# Patient Record
Sex: Female | Born: 1970 | Race: White | Hispanic: No | Marital: Married
Health system: Southern US, Community
[De-identification: ages and names within clinical notes are randomized; demographics above are authoritative.]

## PROBLEM LIST (undated history)

## (undated) DIAGNOSIS — Z789 Other specified health status: Secondary | ICD-10-CM

---

## 1997-12-28 ENCOUNTER — Other Ambulatory Visit: Admission: RE | Admit: 1997-12-28 | Discharge: 1997-12-28 | Payer: Self-pay | Admitting: *Deleted

## 1999-07-23 ENCOUNTER — Encounter: Payer: Self-pay | Admitting: Family Medicine

## 1999-07-23 ENCOUNTER — Encounter: Admission: RE | Admit: 1999-07-23 | Discharge: 1999-07-23 | Payer: Self-pay | Admitting: Family Medicine

## 2001-04-29 ENCOUNTER — Other Ambulatory Visit: Admission: RE | Admit: 2001-04-29 | Discharge: 2001-04-29 | Payer: Self-pay | Admitting: Family Medicine

## 2001-06-08 ENCOUNTER — Encounter: Admission: RE | Admit: 2001-06-08 | Discharge: 2001-06-08 | Payer: Self-pay | Admitting: Family Medicine

## 2001-06-08 ENCOUNTER — Encounter: Payer: Self-pay | Admitting: Family Medicine

## 2001-11-04 ENCOUNTER — Encounter: Payer: Self-pay | Admitting: Obstetrics and Gynecology

## 2001-11-04 ENCOUNTER — Ambulatory Visit (HOSPITAL_COMMUNITY): Admission: RE | Admit: 2001-11-04 | Discharge: 2001-11-04 | Payer: Self-pay | Admitting: Obstetrics and Gynecology

## 2002-03-29 ENCOUNTER — Inpatient Hospital Stay (HOSPITAL_COMMUNITY): Admission: AD | Admit: 2002-03-29 | Discharge: 2002-03-29 | Payer: Self-pay | Admitting: Obstetrics and Gynecology

## 2002-03-30 ENCOUNTER — Inpatient Hospital Stay (HOSPITAL_COMMUNITY): Admission: AD | Admit: 2002-03-30 | Discharge: 2002-04-01 | Payer: Self-pay | Admitting: Obstetrics and Gynecology

## 2002-05-13 ENCOUNTER — Other Ambulatory Visit: Admission: RE | Admit: 2002-05-13 | Discharge: 2002-05-13 | Payer: Self-pay | Admitting: Obstetrics and Gynecology

## 2003-09-05 ENCOUNTER — Other Ambulatory Visit: Admission: RE | Admit: 2003-09-05 | Discharge: 2003-09-05 | Payer: Self-pay | Admitting: Obstetrics and Gynecology

## 2005-02-06 ENCOUNTER — Other Ambulatory Visit: Admission: RE | Admit: 2005-02-06 | Discharge: 2005-02-06 | Payer: Self-pay | Admitting: Obstetrics and Gynecology

## 2005-03-04 ENCOUNTER — Encounter: Admission: RE | Admit: 2005-03-04 | Discharge: 2005-03-04 | Payer: Self-pay | Admitting: Obstetrics and Gynecology

## 2005-04-01 ENCOUNTER — Encounter: Admission: RE | Admit: 2005-04-01 | Discharge: 2005-04-01 | Payer: Self-pay | Admitting: Obstetrics and Gynecology

## 2005-05-01 ENCOUNTER — Encounter: Admission: RE | Admit: 2005-05-01 | Discharge: 2005-05-01 | Payer: Self-pay | Admitting: Obstetrics and Gynecology

## 2005-05-01 ENCOUNTER — Encounter (INDEPENDENT_AMBULATORY_CARE_PROVIDER_SITE_OTHER): Payer: Self-pay | Admitting: Specialist

## 2005-05-01 HISTORY — PX: BREAST BIOPSY: SHX20

## 2005-11-15 ENCOUNTER — Ambulatory Visit (HOSPITAL_COMMUNITY): Admission: RE | Admit: 2005-11-15 | Discharge: 2005-11-15 | Payer: Self-pay | Admitting: Obstetrics and Gynecology

## 2005-12-31 ENCOUNTER — Inpatient Hospital Stay (HOSPITAL_COMMUNITY): Admission: RE | Admit: 2005-12-31 | Discharge: 2006-01-02 | Payer: Self-pay | Admitting: Obstetrics and Gynecology

## 2006-02-17 ENCOUNTER — Ambulatory Visit (HOSPITAL_COMMUNITY): Admission: RE | Admit: 2006-02-17 | Discharge: 2006-02-17 | Payer: Self-pay | Admitting: Obstetrics and Gynecology

## 2006-04-18 ENCOUNTER — Ambulatory Visit (HOSPITAL_COMMUNITY): Admission: RE | Admit: 2006-04-18 | Discharge: 2006-04-18 | Payer: Self-pay | Admitting: Obstetrics and Gynecology

## 2006-04-25 ENCOUNTER — Ambulatory Visit (HOSPITAL_COMMUNITY): Admission: RE | Admit: 2006-04-25 | Discharge: 2006-04-25 | Payer: Self-pay | Admitting: Obstetrics and Gynecology

## 2006-05-19 ENCOUNTER — Ambulatory Visit: Payer: Self-pay | Admitting: Internal Medicine

## 2007-03-29 IMAGING — US UNKNOWN US STUDY
1 series · 13 of 16 positions shown · non-contrast
Comparison: The [REDACTED] mammograms 04-01-05 and ultrasound.

LEFT BREAST ULTRASOUND-GUIDED CORE BIOPSY:
CLINICAL DATA: Probable fibroadenoma, left breast 12 o'clock location.  The patient is now four 
weeks pregnant and has opted for biopsy rather than short term interval follow-up.

[Series 2: unknown us study · 13 of 19 slices shown]
[im 1/19]
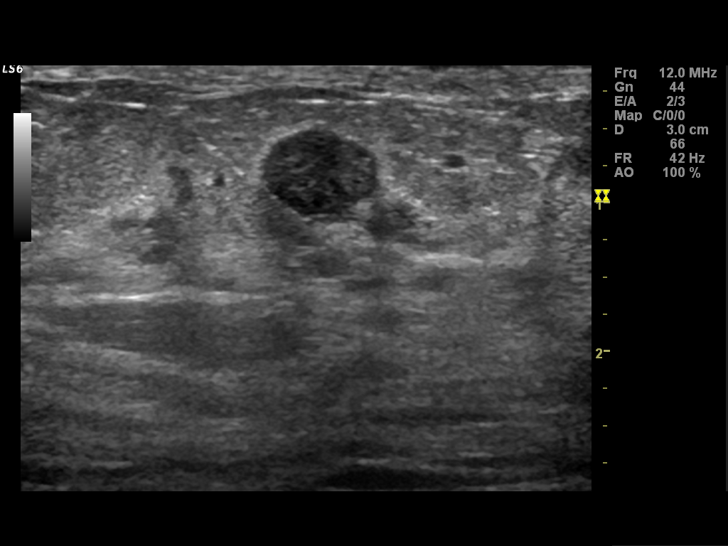
[im 2/19]
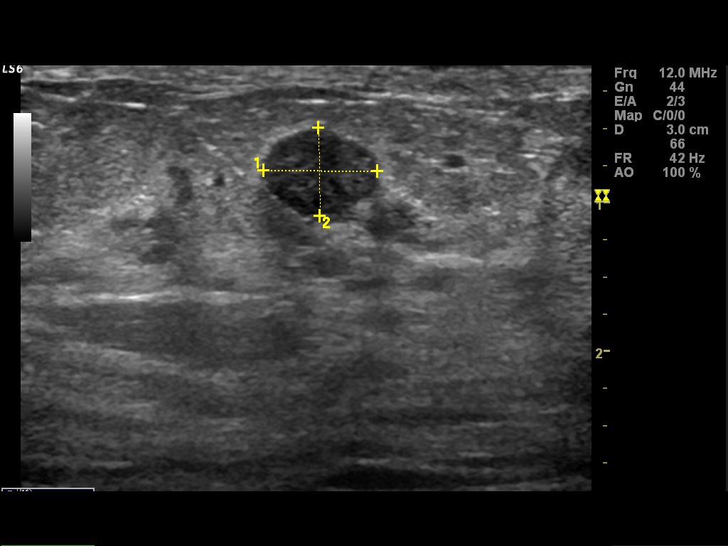
[im 4/19]
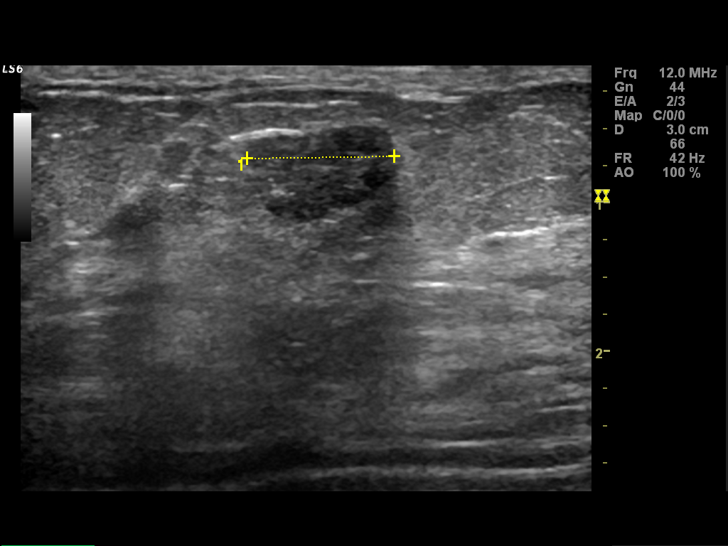
[im 5/19]
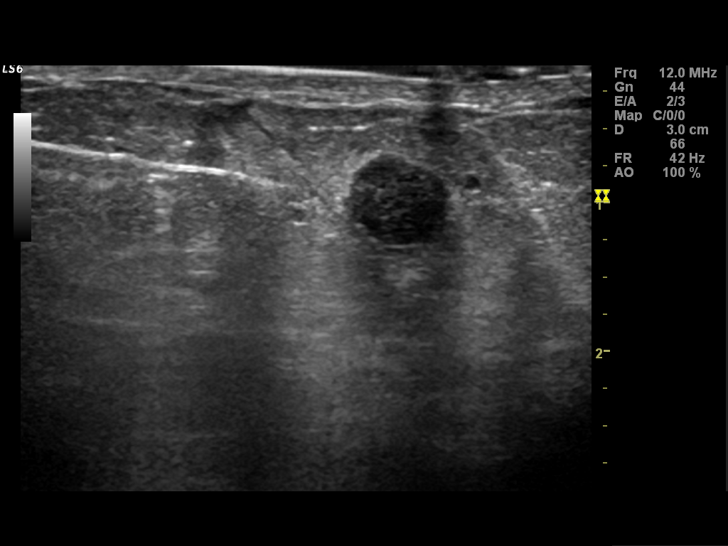
[im 7/19]
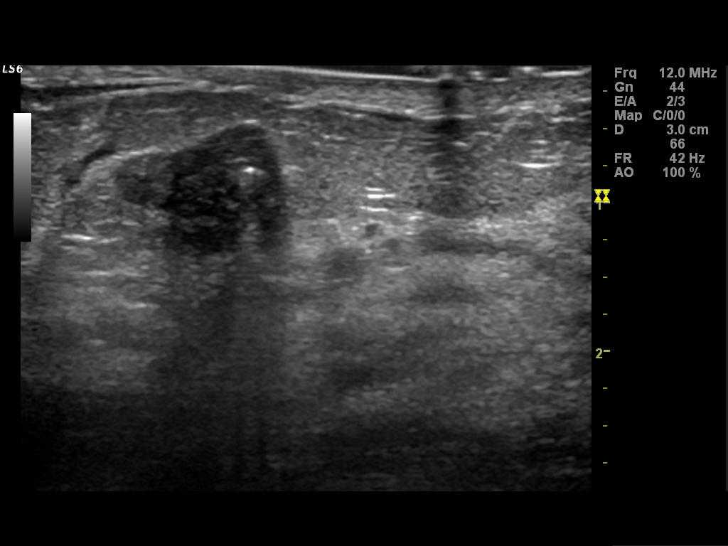
[im 8/19]
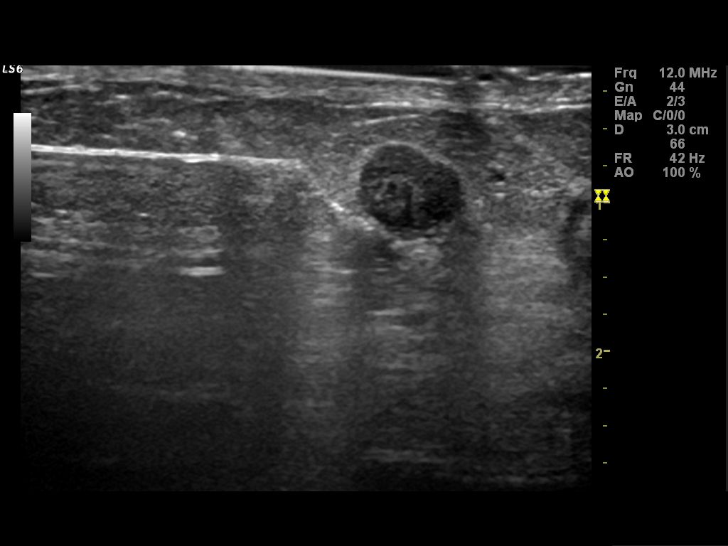
[im 10/19]
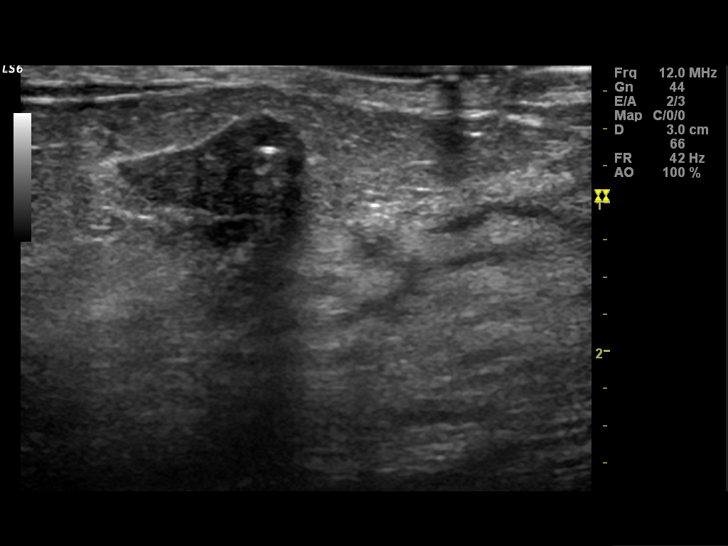
[im 11/19]
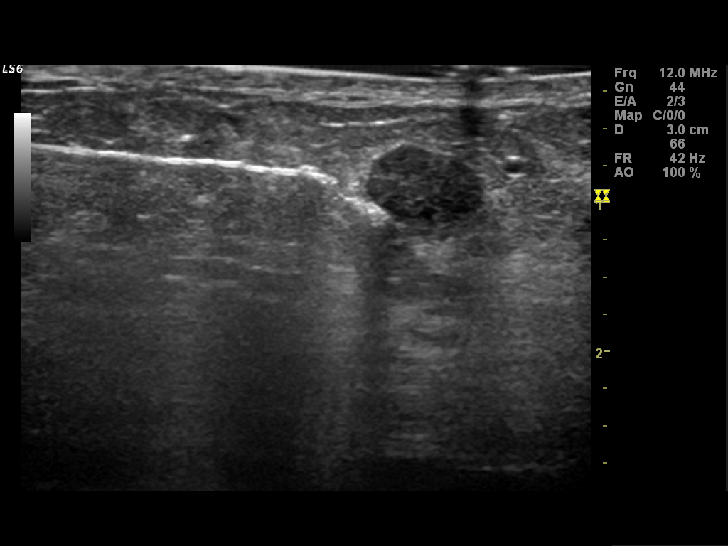
[im 13/19]
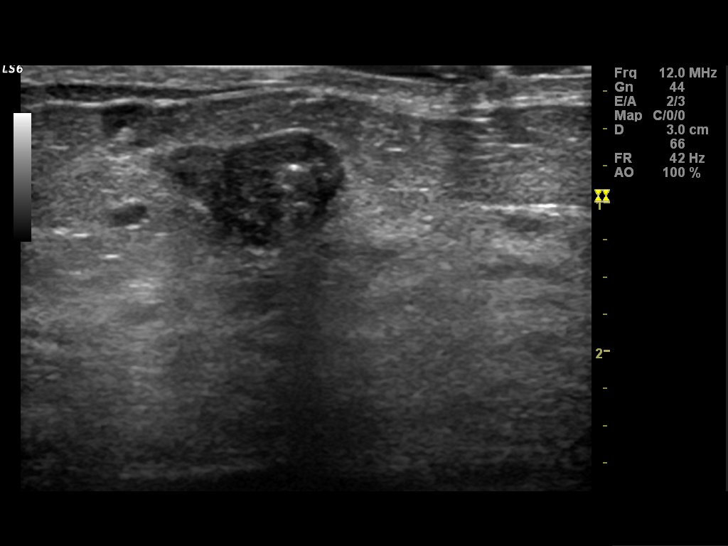
[im 14/19]
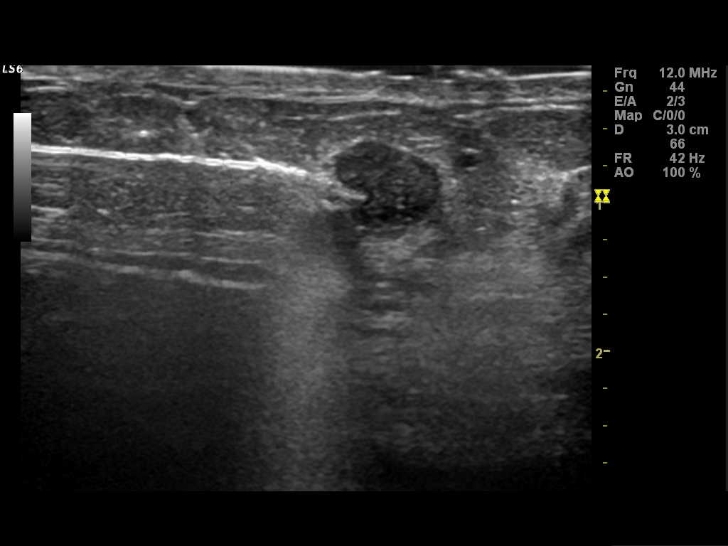
[im 15/19]
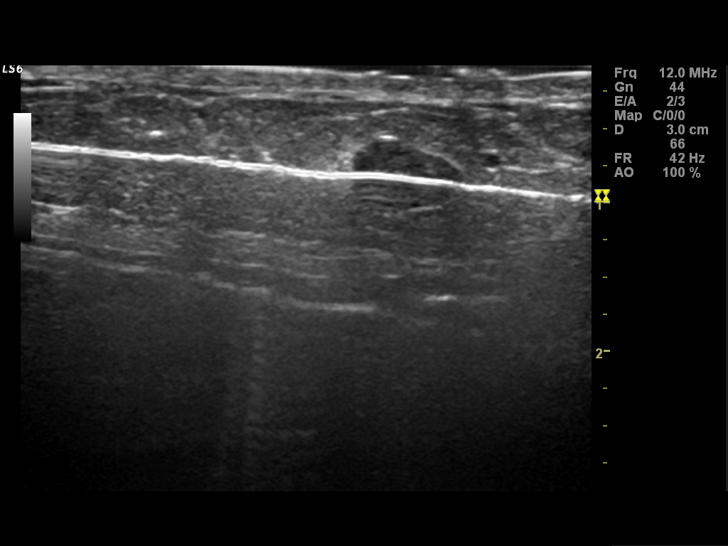
[im 17/19]
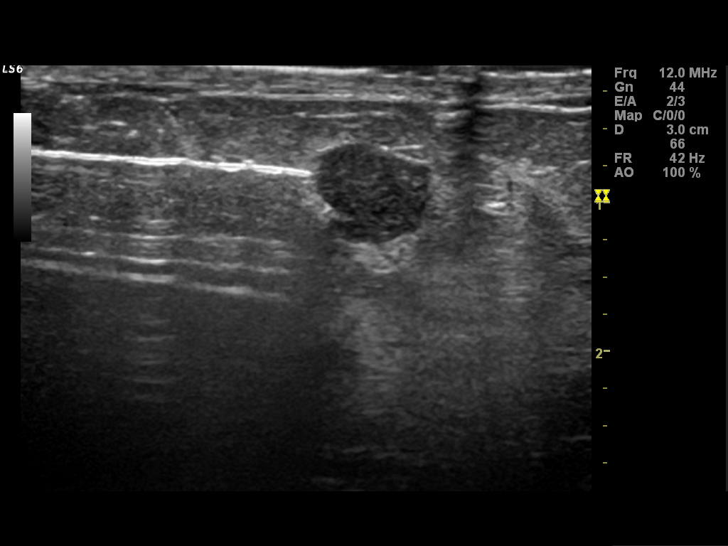
[im 19/19]
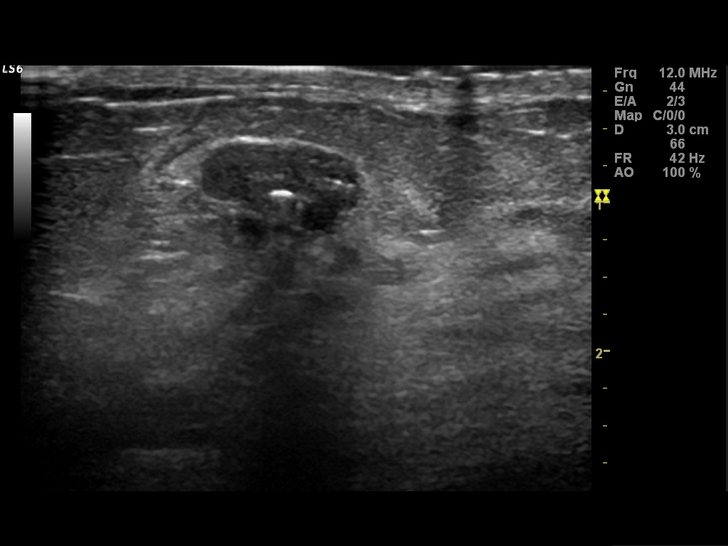

[13 of 16 positions shown; findings below may reference images not displayed]

The risks, benefits, and alternatives to the procedure, including potential need for surgical 
excision in the event of a nondiagnostic specimen,  were discussed with the patient and informed 
written consent was obtained.

The left breast was prepped and draped in the usual sterile fashion.  2% Lidocaine was utilized for
local anesthesia.  Using a medial to lateral approach and a 14-gauge automated Nafeti biopsy needle,
a total of five 14-gauge core biopsy samples were obtained through the mass.  No clip was left as 
the mass remained visible at the end of the procedure and due to the most likely benign nature of 
this lesion, the potential risks of postprocedure mammography were avoided due to the patient being
pregnant.  The patient tolerated the procedure without immediate complication.
IMPRESSION: Ultrasound-guided left breast core biopsy for 12 o'clock position mass,  without clip placement.  
Pathology is pending.

PATHOLOGY RESULTS: BENIGN
Benign fibroadenoma.

Pathology results are dictated by Arslan Prescott, RN, on 05-02-05.

Pathology reveals a fibroadenoma.  This is found to be concordant by Dr. Race Laborde.  I called 
the patient to relay her pathology results.  She stated that her biopsy site was completely benign 
with no bleeding, bruising or tenderness.  The patient was instructed to return at age 39, she is 
currently 34 years old, for her screening mammograms to begin.  The patient was instructed to 
continue self breast exam and clinical exam until that time.   The patient is also instructed to 
biopsy site.

Screening mammogram of both breasts at age 39.

## 2007-08-17 ENCOUNTER — Encounter: Admission: RE | Admit: 2007-08-17 | Discharge: 2007-08-17 | Payer: Self-pay | Admitting: Emergency Medicine

## 2007-08-18 ENCOUNTER — Encounter: Admission: RE | Admit: 2007-08-18 | Discharge: 2007-08-18 | Payer: Self-pay | Admitting: Obstetrics and Gynecology

## 2009-07-15 IMAGING — MG MM DIAGNOSTIC BILATERAL
4 series · 4 of 4 positions shown · non-contrast
Comparison: 03/04/2005

CLINICAL DATA: Diffuse left lateral breast pain.

DIGITAL DIAGNOSTIC BILATERAL MAMMOGRAM WITH CAD

[R CC]
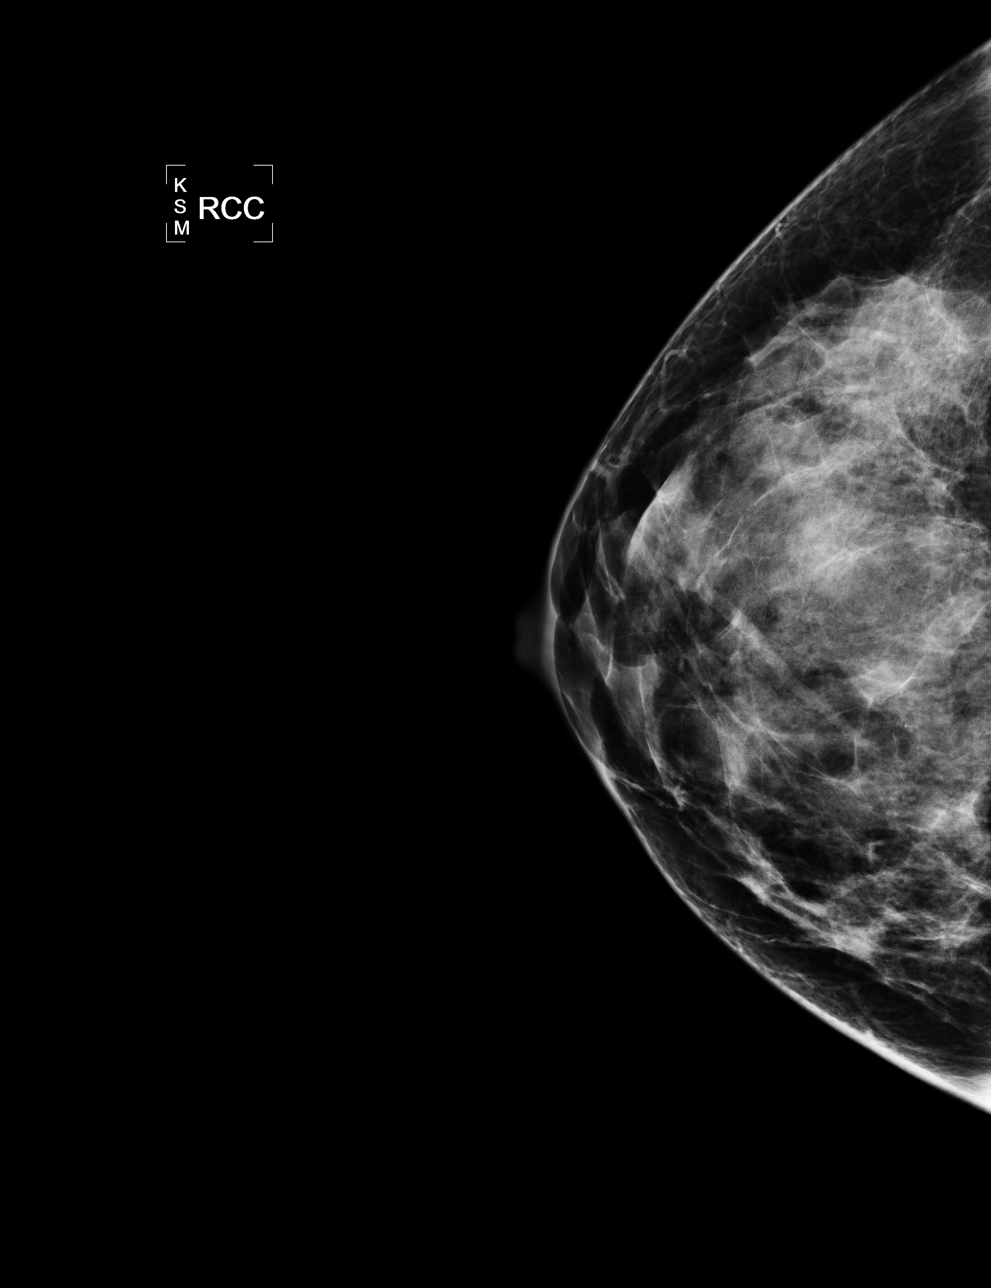

[L CC]
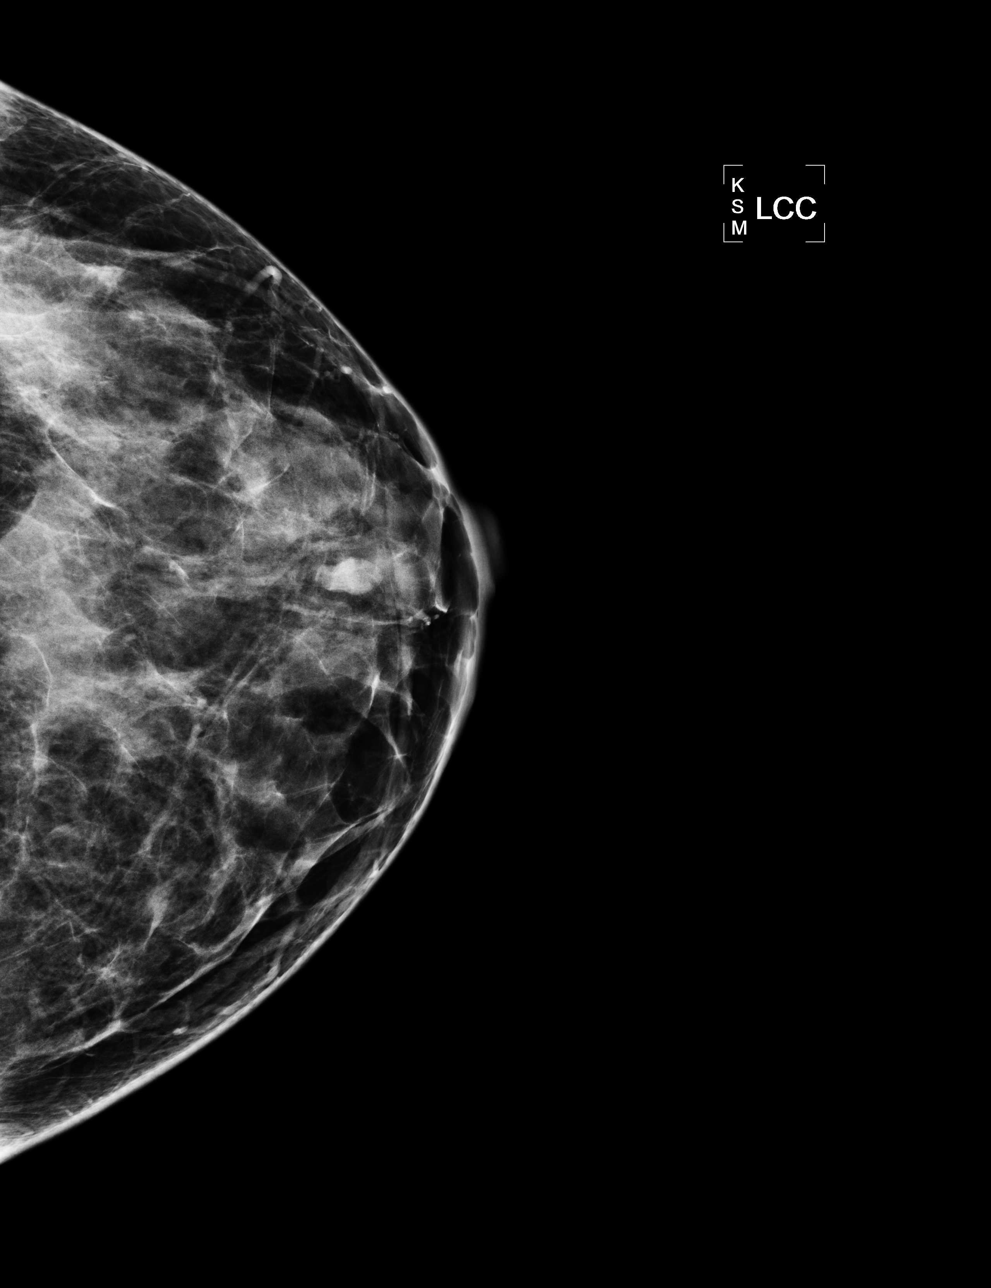

[L MLO]
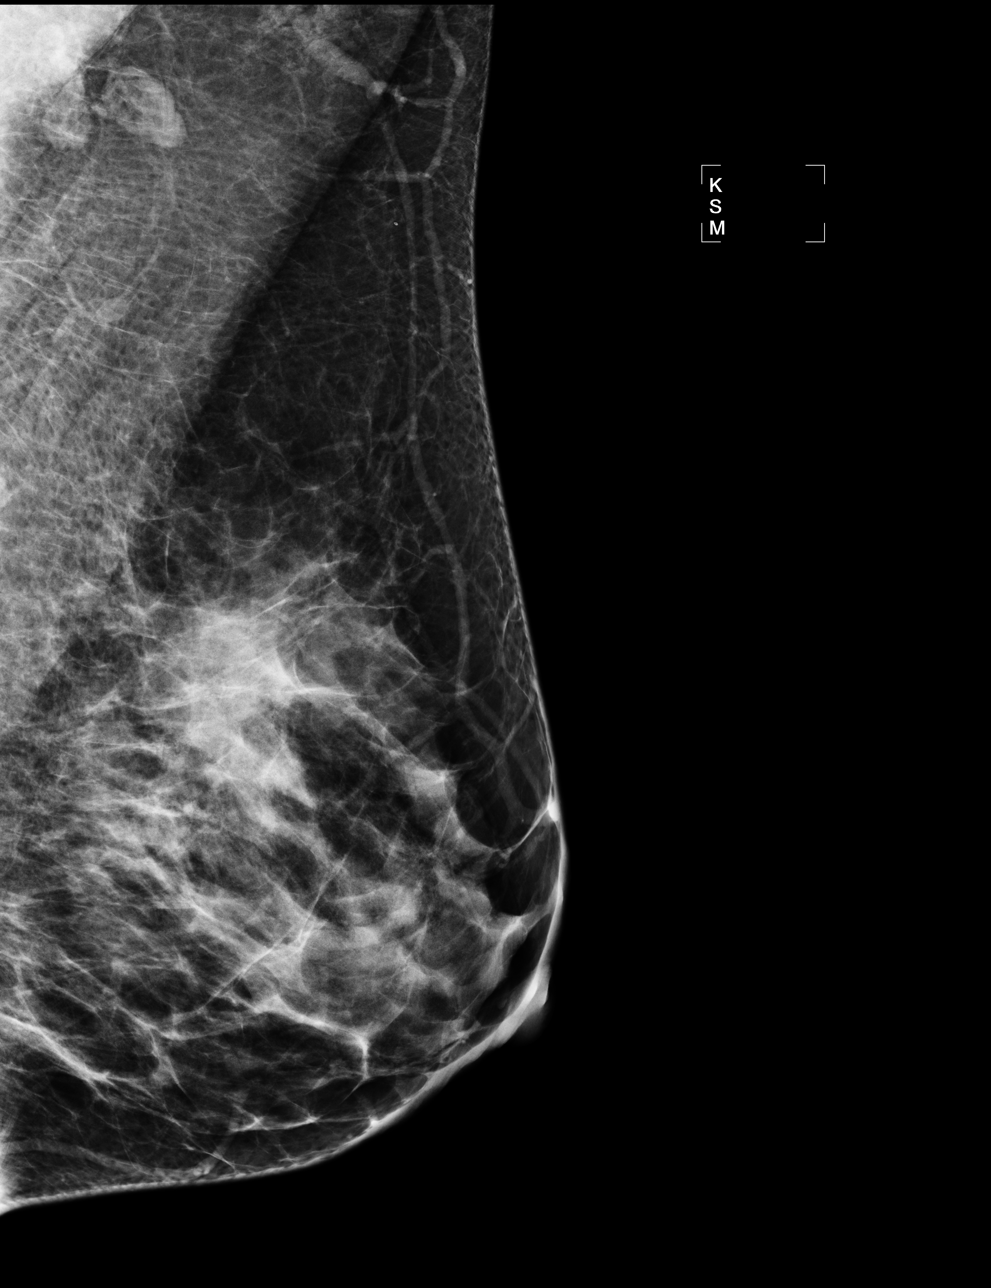

[R MLO]
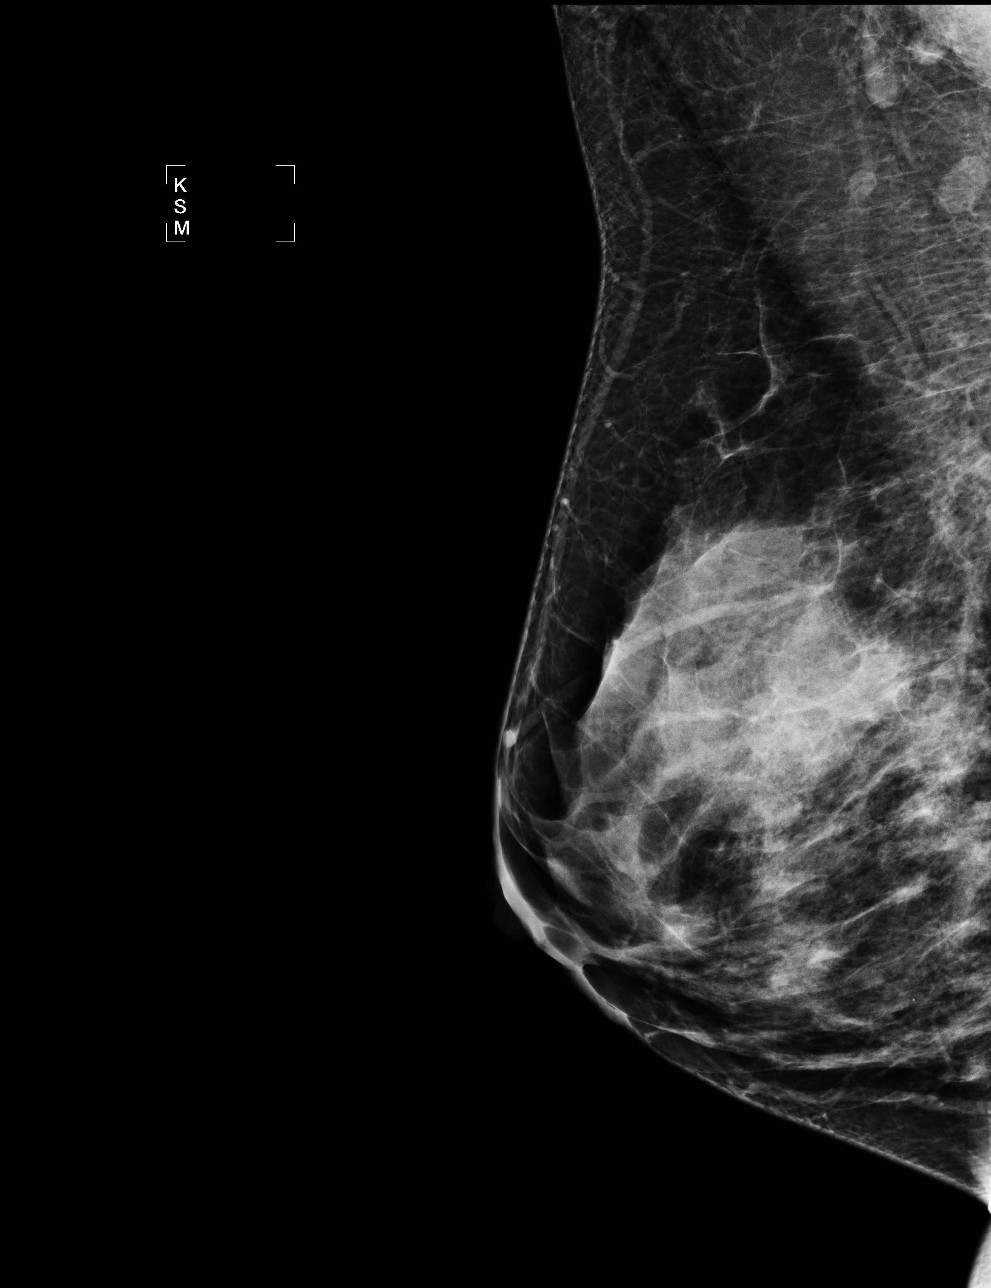

[4 of 4 positions shown; findings below may reference images not displayed]

FINDINGS: The breast parenchyma is extremely dense, which may
limit the sensitivity of mammography.  No suspicious mass,
calcification, or architectural distortion is seen. A benign
appearing left retroareolar mass is again noted.  This is unchanged
and was previously evaluated.
IMPRESSION: No evidence for malignancy in either breast. Screening mammography
is recommended beginning at the age of 39. Findings and
recommendations discussed with the patient and provided in written
form at the time of the exam.

BI-RADS CATEGORY 1:  Negative.

## 2009-09-27 ENCOUNTER — Encounter: Admission: RE | Admit: 2009-09-27 | Discharge: 2009-09-27 | Payer: Self-pay | Admitting: Obstetrics and Gynecology

## 2010-06-15 NOTE — Discharge Summary (Signed)
NAME:  Joy Allen, Joy Allen                       ACCOUNT NO.:  [id number]   MEDICAL RECORD NO.:  [id number]                   PATIENT TYPE:  INP   LOCATION:  9107                                 FACILITY:  [facility]   PHYSICIAN:  Huel Cote, M.D.              DATE OF BIRTH:  1970-08-17   DATE OF ADMISSION:  03/30/2002  DATE OF DISCHARGE:  04/01/2002                                 DISCHARGE SUMMARY   DISCHARGE DIAGNOSES:  1. Term pregnancy at 39 weeks, delivered.  2. Positive group B Strep status.  3. Status post vacuum assisted vaginal delivery.   DISCHARGE MEDICATIONS:  1. Motrin 600 mg p.o. q.6h.  2. Percocet one to two tablets p.o. q.4h. p.r.n.   DISCHARGE FOLLOWUP:  The patient is to follow up in six weeks for her  routine postpartum examination.   HOSPITAL COURSE:  The patient is a 40 year old G1, P0 who is admitted at [redacted]  weeks gestation in labor with cervical change to 4 cm noted in the office.  The patient had not ruptured membranes on admission and was contracting  approximately every five minutes.  Prenatal care was uncomplicated except  for positive group B Strep status.   PRENATAL LABORATORIES:  B+.  Antibody negative.  RPR nonreactive.  Rubella  immune.  Hepatitis B surface antigen negative.  HIV declined.  GC negative.  Chlamydia negative.  Group B Strep positive.  One hour Glucola 140.  Three  hour was within normal limits.  Triple screen was normal.   PAST OBSTETRICAL HISTORY:  None.   PAST GYNECOLOGIC HISTORY:  None.   PAST MEDICAL HISTORY:  None.   PAST SURGICAL HISTORY:  She has had cosmetic surgery on her ears in 1996.   ALLERGIES:  None.   PHYSICAL EXAMINATION:  VITAL SIGNS:  The patient is afebrile with stable  vital signs.  Fetal heart rate was reassuring.  ABDOMEN:  On admission she was gravid and nontender.  Estimated fetal weight  was 7.5-8 pounds.  PELVIC:  Cervix was 80 and 5 at a -1 station on her arrival to labor and  delivery.  She had  ruptured membranes performed and clear fluid was  obtained.  The patient reached complete dilation after receiving epidural  anesthesia and pushed for approximately two and a half hours with descent of  the fetal head to a +3 station.  Secondary to exhaustion the patient was  counseled for assisted vaginal delivery and was agreeable.  A Mityvac was  then applied to LOA vertex and the infant's head delivered in two easy pulls  over a midline episiotomy.  There was no nuchal cord.  A vigorous female  infant delivered with Apgars of 9 and 9, weight 7 pounds 10 ounces.  Placenta delivered spontaneously.  Secondary laceration was repaired with 2-  0 Vicryl and 3-0 Vicryl.  Cervix and rectum were intact.  Estimated blood loss was 450 mL.  On  postpartum day number two the patient  did very well.  She was afebrile with stable vital signs.  Her pain was well  controlled and she was felt stable for discharge home.  Therefore, she was  discharged with medications and follow-up as previously stated.                                               Huel Cote, M.D.    KR/MEDQ  D:  04/01/2002  T:  04/01/2002  Job:  [id number]

## 2010-06-15 NOTE — Assessment & Plan Note (Signed)
[facility]                         GASTROENTEROLOGY OFFICE NOTE   Joy Allen, Joy Allen                    MRN:          [id number]  DATE:05/19/2006                            DOB:          05-06-1970    REFERRING PHYSICIAN:  Huel Cote, M.D.   REASON FOR CONSULTATION:  Incidentally identified hepatic hemangiomas.   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 Senaida Ores regarding incidentally  identified hepatic hemangiomas.  The patient underwent fetal ultrasound  when she was about [redacted] weeks pregnant and was noted to have abnormal  lesions on her liver.  An abdominal ultrasound performed November 15, 2005, revealed multiple hyperechoic liver masses (at least 9 in number),  largest measuring 5 cm.  The sonographic features were consistent with  benign hemangiomas.  Follow-up MRI with contrast enhancement was  recommended for confirmation.  No other abnormalities.  A follow-up  ultrasound February 17, 2006, again revealed multiple lesions consistent  with hemangioma.  The largest lesion measuring approximately 4.5 x 4.2 x  3.9 cm was stable.  Patient indeed underwent MRI of the abdomen with and  without contrast.  Again noted were 7-10 hemangiomas scattered  throughout the liver.  The largest in the inferior margin of the right  lobe measured 4.6 cm in diameter.  All along the patient has been  asymptomatic.  She has had no abdominal pain or other symptoms.  She was  really interested in knowing a bit more about hemangiomas and what she  might expect as well as plans for follow-up, if any.   PAST MEDICAL HISTORY:  None.   PAST SURGICAL HISTORY:  None.   ALLERGIES:  NO KNOWN DRUG ALLERGIES.   MEDICATION:  Multivitamin.   FAMILY HISTORY:  Negative for gastrointestinal malignancy or liver  disorders.   SOCIAL HISTORY:  Patient is married with two children.  She lives with  her family.  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  She does not smoke or use alcohol.   REVIEW OF SYSTEMS:  Per diagnostic evaluation form.   PHYSICAL EXAMINATION:  GENERAL APPEARANCE:  A well-appearing female in  no acute distress.  VITAL SIGNS:  Blood pressure is 120/80, heart rate 78, weight 162.8  pounds.  She is 5 feet 8 inches in height.  HEENT:  Sclerae are anicteric.  Conjunctivae are pink.  Oral mucosa  intact.  LUNGS:  Clear.  CARDIOVASCULAR:  Regular.  ABDOMEN:  Soft without tenderness, mass or hernia.  No obvious  organomegaly.  No bruit heard over the liver.  EXTREMITIES:  Without edema.   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  Currently asymptomatic.  We discussed in detail, hemangiomas.  We  discussed the occurrence, presentation, methods of evaluation and  treatment.  For the most part, these lesions are benign and asymptomatic  and need no attention.  Occasionally, there are complications that might  require surgery.We discussed obstruction and spontaneous hemorrhage.  We  also discussed that lesions can increase in size  with estrogens or  pregnancy. She was aware.  I recommend a repeat ultrasound, as well as see her in the office, in  about six months.  If she has questions or problems in the interim she  agrees to contact the office.  If her lesions are stable at that time,  then I would repeat an examination again in one year.  If the lesions  remain stable again, then she could be followed symptomatically.  I  provided her with a text book handout that discusses hepatic hemangioma.  A copy was placed in the chart as well.     Wilhemina Bonito. Marina Goodell, MD  Electronically Signed    JNP/MedQ  DD: 05/19/2006  DT: 05/20/2006  Job #: [id number]   cc:   Huel Cote, M.D.  Talmadge Coventry, M.D.

## 2010-06-15 NOTE — Discharge Summary (Signed)
Joy, Allen             ACCOUNT NO.:  [id number]   MEDICAL RECORD NO.:  [id number]          PATIENT TYPE:  INP   LOCATION:  9122                          FACILITY:  [facility]   PHYSICIAN:  Joy Allen, M.D. DATE OF BIRTH:  1970/10/16   DATE OF ADMISSION:  12/31/2005  DATE OF DISCHARGE:  01/02/2006                               DISCHARGE SUMMARY   DISCHARGE DIAGNOSES:  1. Term pregnancy at 39-4/7 weeks delivered.  2. Status post normal spontaneous vaginal delivery.  3. Maternal liver hemangioma stable throughout pregnancy.   DISCHARGE MEDICATIONS:  1. Motrin 600 mg p.o. q.6h.  2. Percocet 1-2 tablets p.o. q.4h. p.r.n.   FOLLOW UP:  Patient is to follow up in the office in approximately 6  weeks for her routine postpartum exam and also will be scheduled for a  follow-up ultrasound of her liver lesions to ensure stability at that  time.   HOSPITAL COURSE:  Patient is a 40 year old G3, P1-0-1-1, who was  admitted at 39-4/[redacted] weeks gestation for induction of labor given the term  status and favorable cervix.  Prenatal care being complicated only by  advanced maternal age with a normal first trimester screen and also some  incidental liver lesions which were believed to be hemangiomas noted on  ultrasound.  This had been followed and they were stable throughout  pregnancy and the plan was made to follow these up with ultrasound  postpartum after conversation with Dr. Yancey Flemings of [facility].   PRENATAL LABS:  B positive, antibody negative, RPR nonreactive.  Rubella  immune.  Hepatitis B surface antigen negative.  GC and Chlamydia  negative.  Group B strep negative.  One hour Glucola 126.  First  trimester screen normal.   PAST OBSTETRICAL HISTORY:  March 2004, she had a vaginal delivery of 7  pound 10 ounce female infant.   PAST GYNECOLOGICAL HISTORY:  No abnormal Pap smears.   PAST SURGICAL HISTORY:  1. In 1996, she had cosmetic surgery on her ear.  2.  In 2007, a breast biopsy.  3. Maternal liver hemangiomas as previously stated.   ALLERGIES:  NO KNOWN DRUG ALLERGIES.   MEDICATIONS:  Prenatal vitamins.   She was afebrile with stable vital signs.  Fetal heart rate reactive.  Cervix was 53 and a -2 station.  She had rupture of membranes performed.  Reached complete dilation several hours later and pushed well with a  normal spontaneous vaginal delivery of vigorous female infant over  secondary laceration.  Apgars were 8 and 9, weight was 9 pounds 7  ounces.  There was a corporal cord x1.  Placenta delivered  spontaneously.  Cervix and rectum were intact.  Her laceration was  repaired with 2-0 Vicryl.  She was then admitted for routine postpartum  care and did quite well.   On postpartum day #2, her pain was well controlled.  Discharge  hemoglobin was 11.2 and her lochia was normal.  She was afebrile with  stable vital signs and was felt stable for discharge.      Joy Allen, M.D.  Electronically Signed  KR/MEDQ  D:  01/02/2006  T:  01/02/2006  Job:  [id number]

## 2010-08-27 ENCOUNTER — Other Ambulatory Visit: Payer: Self-pay | Admitting: Obstetrics and Gynecology

## 2010-08-27 DIAGNOSIS — Z1231 Encounter for screening mammogram for malignant neoplasm of breast: Secondary | ICD-10-CM

## 2010-10-03 ENCOUNTER — Ambulatory Visit
Admission: RE | Admit: 2010-10-03 | Discharge: 2010-10-03 | Disposition: A | Discharge status: Home / Self Care | Payer: BC Managed Care – PPO | Source: Ambulatory Visit | Attending: Obstetrics and Gynecology | Admitting: Obstetrics and Gynecology

## 2010-10-03 DIAGNOSIS — Z1231 Encounter for screening mammogram for malignant neoplasm of breast: Secondary | ICD-10-CM

## 2010-10-24 ENCOUNTER — Other Ambulatory Visit: Payer: Self-pay | Admitting: Dermatology

## 2010-11-13 ENCOUNTER — Other Ambulatory Visit: Payer: Self-pay | Admitting: Dermatology

## 2011-04-15 ENCOUNTER — Other Ambulatory Visit: Payer: Self-pay | Admitting: Dermatology

## 2011-09-02 ENCOUNTER — Other Ambulatory Visit: Payer: Self-pay | Admitting: Obstetrics and Gynecology

## 2011-09-02 DIAGNOSIS — Z1231 Encounter for screening mammogram for malignant neoplasm of breast: Secondary | ICD-10-CM

## 2011-10-07 ENCOUNTER — Ambulatory Visit
Admission: RE | Admit: 2011-10-07 | Discharge: 2011-10-07 | Disposition: A | Discharge status: Home / Self Care | Payer: BC Managed Care – PPO | Source: Ambulatory Visit | Attending: Obstetrics and Gynecology | Admitting: Obstetrics and Gynecology

## 2011-10-07 DIAGNOSIS — Z1231 Encounter for screening mammogram for malignant neoplasm of breast: Secondary | ICD-10-CM

## 2012-09-08 ENCOUNTER — Other Ambulatory Visit: Payer: Self-pay

## 2012-09-08 DIAGNOSIS — Z1231 Encounter for screening mammogram for malignant neoplasm of breast: Secondary | ICD-10-CM

## 2012-10-07 ENCOUNTER — Ambulatory Visit
Admission: RE | Admit: 2012-10-07 | Discharge: 2012-10-07 | Disposition: A | Discharge status: Home / Self Care | Payer: BC Managed Care – PPO | Source: Ambulatory Visit

## 2012-10-07 DIAGNOSIS — Z1231 Encounter for screening mammogram for malignant neoplasm of breast: Secondary | ICD-10-CM

## 2013-09-08 ENCOUNTER — Other Ambulatory Visit: Payer: Self-pay

## 2013-09-08 DIAGNOSIS — Z1231 Encounter for screening mammogram for malignant neoplasm of breast: Secondary | ICD-10-CM

## 2013-10-08 ENCOUNTER — Ambulatory Visit: Payer: BC Managed Care – PPO

## 2013-10-22 ENCOUNTER — Ambulatory Visit
Admission: RE | Admit: 2013-10-22 | Discharge: 2013-10-22 | Disposition: A | Discharge status: Home / Self Care | Payer: BC Managed Care – PPO | Source: Ambulatory Visit

## 2013-10-22 DIAGNOSIS — Z1231 Encounter for screening mammogram for malignant neoplasm of breast: Secondary | ICD-10-CM

## 2014-09-19 ENCOUNTER — Other Ambulatory Visit: Payer: Self-pay

## 2014-09-19 DIAGNOSIS — Z1231 Encounter for screening mammogram for malignant neoplasm of breast: Secondary | ICD-10-CM

## 2014-10-27 ENCOUNTER — Ambulatory Visit
Admission: RE | Admit: 2014-10-27 | Discharge: 2014-10-27 | Disposition: A | Discharge status: Home / Self Care | Payer: BC Managed Care – PPO | Source: Ambulatory Visit

## 2014-10-27 DIAGNOSIS — Z1231 Encounter for screening mammogram for malignant neoplasm of breast: Secondary | ICD-10-CM

## 2015-09-25 ENCOUNTER — Other Ambulatory Visit: Payer: Self-pay | Admitting: Obstetrics and Gynecology

## 2015-09-25 DIAGNOSIS — Z1231 Encounter for screening mammogram for malignant neoplasm of breast: Secondary | ICD-10-CM

## 2015-10-30 ENCOUNTER — Ambulatory Visit
Admission: RE | Admit: 2015-10-30 | Discharge: 2015-10-30 | Disposition: A | Discharge status: Home / Self Care | Payer: BC Managed Care – PPO | Source: Ambulatory Visit | Attending: Obstetrics and Gynecology | Admitting: Obstetrics and Gynecology

## 2015-10-30 DIAGNOSIS — Z1231 Encounter for screening mammogram for malignant neoplasm of breast: Secondary | ICD-10-CM

## 2016-07-03 ENCOUNTER — Other Ambulatory Visit: Payer: Self-pay | Admitting: Obstetrics and Gynecology

## 2016-07-03 DIAGNOSIS — R5381 Other malaise: Secondary | ICD-10-CM

## 2016-07-04 ENCOUNTER — Other Ambulatory Visit: Payer: Self-pay | Admitting: Obstetrics and Gynecology

## 2016-07-04 DIAGNOSIS — N644 Mastodynia: Secondary | ICD-10-CM

## 2016-07-08 ENCOUNTER — Ambulatory Visit
Admission: RE | Admit: 2016-07-08 | Discharge: 2016-07-08 | Disposition: A | Discharge status: Home / Self Care | Payer: BC Managed Care – PPO | Source: Ambulatory Visit | Attending: Obstetrics and Gynecology | Admitting: Obstetrics and Gynecology

## 2016-07-08 ENCOUNTER — Other Ambulatory Visit: Payer: Self-pay | Admitting: Obstetrics and Gynecology

## 2016-07-08 DIAGNOSIS — N644 Mastodynia: Secondary | ICD-10-CM

## 2016-07-08 DIAGNOSIS — N6320 Unspecified lump in the left breast, unspecified quadrant: Secondary | ICD-10-CM

## 2016-07-09 ENCOUNTER — Ambulatory Visit
Admission: RE | Admit: 2016-07-09 | Discharge: 2016-07-09 | Disposition: A | Discharge status: Home / Self Care | Payer: BC Managed Care – PPO | Source: Ambulatory Visit | Attending: Obstetrics and Gynecology | Admitting: Obstetrics and Gynecology

## 2016-07-09 DIAGNOSIS — N6320 Unspecified lump in the left breast, unspecified quadrant: Secondary | ICD-10-CM

## 2016-10-04 ENCOUNTER — Other Ambulatory Visit: Payer: Self-pay | Admitting: Obstetrics and Gynecology

## 2016-10-04 DIAGNOSIS — Z1231 Encounter for screening mammogram for malignant neoplasm of breast: Secondary | ICD-10-CM

## 2016-11-01 ENCOUNTER — Ambulatory Visit
Admission: RE | Admit: 2016-11-01 | Discharge: 2016-11-01 | Disposition: A | Discharge status: Home / Self Care | Payer: BC Managed Care – PPO | Source: Ambulatory Visit | Attending: Obstetrics and Gynecology | Admitting: Obstetrics and Gynecology

## 2016-11-01 DIAGNOSIS — Z1231 Encounter for screening mammogram for malignant neoplasm of breast: Secondary | ICD-10-CM

## 2017-10-03 ENCOUNTER — Other Ambulatory Visit: Payer: Self-pay | Admitting: Obstetrics and Gynecology

## 2017-10-03 DIAGNOSIS — Z1231 Encounter for screening mammogram for malignant neoplasm of breast: Secondary | ICD-10-CM

## 2017-11-03 ENCOUNTER — Ambulatory Visit
Admission: RE | Admit: 2017-11-03 | Discharge: 2017-11-03 | Disposition: A | Discharge status: Home / Self Care | Payer: BC Managed Care – PPO | Source: Ambulatory Visit | Attending: Obstetrics and Gynecology | Admitting: Obstetrics and Gynecology

## 2017-11-03 DIAGNOSIS — Z1231 Encounter for screening mammogram for malignant neoplasm of breast: Secondary | ICD-10-CM

## 2018-10-12 ENCOUNTER — Other Ambulatory Visit: Payer: Self-pay | Admitting: Obstetrics and Gynecology

## 2018-10-12 DIAGNOSIS — Z1231 Encounter for screening mammogram for malignant neoplasm of breast: Secondary | ICD-10-CM

## 2018-11-24 ENCOUNTER — Ambulatory Visit: Payer: BC Managed Care – PPO

## 2018-12-21 ENCOUNTER — Ambulatory Visit
Admission: RE | Admit: 2018-12-21 | Discharge: 2018-12-21 | Disposition: A | Discharge status: Home / Self Care | Payer: BC Managed Care – PPO | Source: Ambulatory Visit | Attending: Obstetrics and Gynecology | Admitting: Obstetrics and Gynecology

## 2018-12-21 ENCOUNTER — Other Ambulatory Visit: Payer: Self-pay

## 2018-12-21 DIAGNOSIS — Z1231 Encounter for screening mammogram for malignant neoplasm of breast: Secondary | ICD-10-CM

## 2019-01-02 ENCOUNTER — Other Ambulatory Visit: Payer: Self-pay

## 2019-01-02 DIAGNOSIS — Z20822 Contact with and (suspected) exposure to covid-19: Secondary | ICD-10-CM

## 2019-01-05 LAB — NOVEL CORONAVIRUS, NAA: SARS-CoV-2, NAA: NOT DETECTED

## 2019-01-08 ENCOUNTER — Other Ambulatory Visit: Payer: Self-pay

## 2019-01-08 DIAGNOSIS — Z20822 Contact with and (suspected) exposure to covid-19: Secondary | ICD-10-CM

## 2019-01-09 LAB — NOVEL CORONAVIRUS, NAA: SARS-CoV-2, NAA: DETECTED — AB

## 2019-01-18 ENCOUNTER — Other Ambulatory Visit: Payer: Self-pay | Admitting: Cardiology

## 2019-01-18 DIAGNOSIS — Z20822 Contact with and (suspected) exposure to covid-19: Secondary | ICD-10-CM

## 2019-01-20 LAB — NOVEL CORONAVIRUS, NAA: SARS-CoV-2, NAA: NOT DETECTED

## 2019-03-19 ENCOUNTER — Ambulatory Visit: Payer: BC Managed Care – PPO | Attending: Internal Medicine

## 2019-03-19 DIAGNOSIS — Z23 Encounter for immunization: Secondary | ICD-10-CM | POA: Insufficient documentation

## 2019-03-19 NOTE — Progress Notes (Signed)
   Covid-19 Vaccination Clinic  Name:  PRISCILLE SHADDUCK    MRN: [id number] DOB: [birth year]  03/19/2019  Ms. Bilski was observed post Covid-19 immunization for 15 minutes without incidence. She was provided with Vaccine Information Sheet and instruction to access the V-Safe system.   Ms. Kastens was instructed to call 911 with any severe reactions post vaccine: Marland Kitchen Difficulty breathing  . Swelling of your face and throat  . A fast heartbeat  . A bad rash all over your body  . Dizziness and weakness    Immunizations Administered    Name Date Dose VIS Date Route   Pfizer COVID-19 Vaccine 03/19/2019 11:20 AM 0.3 mL 01/08/2019 Intramuscular   Manufacturer: [employer], [employer]   Lot: [id number]   NDC: [address]-6

## 2019-04-13 ENCOUNTER — Ambulatory Visit: Payer: BC Managed Care – PPO | Attending: Internal Medicine

## 2019-04-13 DIAGNOSIS — Z23 Encounter for immunization: Secondary | ICD-10-CM

## 2019-04-13 NOTE — Progress Notes (Signed)
   Covid-19 Vaccination Clinic  Name:  ALAENA STRADER    MRN: [id number] DOB: 04-25-1970  04/13/2019  Ms. Katayama was observed post Covid-19 immunization for 15 minutes without incident. She was provided with Vaccine Information Sheet and instruction to access the V-Safe system.   Ms. Governale was instructed to call 911 with any severe reactions post vaccine: Marland Kitchen Difficulty breathing  . Swelling of face and throat  . A fast heartbeat  . A bad rash all over body  . Dizziness and weakness   Immunizations Administered    Name Date Dose VIS Date Route   Pfizer COVID-19 Vaccine 04/13/2019 12:55 PM 0.3 mL 01/08/2019 Intramuscular   Manufacturer: [employer], [employer]   Lot: [id number]   NDC: [address]-7

## 2019-11-23 ENCOUNTER — Other Ambulatory Visit: Payer: Self-pay | Admitting: Obstetrics and Gynecology

## 2019-11-23 DIAGNOSIS — Z1231 Encounter for screening mammogram for malignant neoplasm of breast: Secondary | ICD-10-CM

## 2020-01-03 ENCOUNTER — Ambulatory Visit
Admission: RE | Admit: 2020-01-03 | Discharge: 2020-01-03 | Disposition: A | Discharge status: Home / Self Care | Payer: BC Managed Care – PPO | Source: Ambulatory Visit | Attending: Obstetrics and Gynecology | Admitting: Obstetrics and Gynecology

## 2020-01-03 ENCOUNTER — Other Ambulatory Visit: Payer: Self-pay

## 2020-01-03 DIAGNOSIS — Z1231 Encounter for screening mammogram for malignant neoplasm of breast: Secondary | ICD-10-CM

## 2020-11-20 ENCOUNTER — Other Ambulatory Visit: Payer: Self-pay | Admitting: Obstetrics and Gynecology

## 2020-11-20 DIAGNOSIS — Z1231 Encounter for screening mammogram for malignant neoplasm of breast: Secondary | ICD-10-CM

## 2021-01-15 ENCOUNTER — Ambulatory Visit
Admission: RE | Admit: 2021-01-15 | Discharge: 2021-01-15 | Disposition: A | Discharge status: Home / Self Care | Payer: BC Managed Care – PPO | Source: Ambulatory Visit | Attending: Obstetrics and Gynecology | Admitting: Obstetrics and Gynecology

## 2021-01-15 DIAGNOSIS — Z1231 Encounter for screening mammogram for malignant neoplasm of breast: Secondary | ICD-10-CM

## 2021-12-04 ENCOUNTER — Other Ambulatory Visit: Payer: Self-pay | Admitting: Obstetrics and Gynecology

## 2021-12-04 DIAGNOSIS — Z1231 Encounter for screening mammogram for malignant neoplasm of breast: Secondary | ICD-10-CM

## 2022-02-05 ENCOUNTER — Ambulatory Visit
Admission: RE | Admit: 2022-02-05 | Discharge: 2022-02-05 | Disposition: A | Discharge status: Home / Self Care | Payer: BC Managed Care – PPO | Source: Ambulatory Visit | Attending: Obstetrics and Gynecology | Admitting: Obstetrics and Gynecology

## 2022-02-05 DIAGNOSIS — Z1231 Encounter for screening mammogram for malignant neoplasm of breast: Secondary | ICD-10-CM

## 2022-12-24 ENCOUNTER — Other Ambulatory Visit: Payer: Self-pay | Admitting: Obstetrics and Gynecology

## 2022-12-24 DIAGNOSIS — Z1231 Encounter for screening mammogram for malignant neoplasm of breast: Secondary | ICD-10-CM

## 2023-02-10 ENCOUNTER — Ambulatory Visit
Admission: RE | Admit: 2023-02-10 | Discharge: 2023-02-10 | Disposition: A | Discharge status: Home / Self Care | Payer: 59 | Source: Ambulatory Visit | Attending: Obstetrics and Gynecology | Admitting: Obstetrics and Gynecology

## 2023-02-10 DIAGNOSIS — Z1231 Encounter for screening mammogram for malignant neoplasm of breast: Secondary | ICD-10-CM

## 2023-04-15 ENCOUNTER — Encounter (HOSPITAL_COMMUNITY): Payer: Self-pay | Admitting: Obstetrics and Gynecology

## 2023-04-15 NOTE — Progress Notes (Signed)
 Spoke w/ via phone for pre-op interview--- Hema Lab needs dos---- UPT per anesthesia, surgeon orders requested 04/15/23.        Lab results------ COVID test -----patient states asymptomatic no test needed Arrive at -------0830 NPO after MN NO Solid Food.  Clear liquids from MN until---0730 Pre-Surgery Ensure or G2:  Med rec completed Medications to take morning of surgery -----Cipro and Levaquin Diabetic medication -----  GLP1 agonist last dose: GLP1 instructions:  Patient instructed no nail polish to be worn day of surgery Patient instructed to bring photo id and insurance card day of surgery Patient aware to have Driver (ride ) / caregiver    for 24 hours after surgery - Husband Fantasha Daniele or mother Rolly Salter Patient Special Instructions -----Shower with antibacterial soap. Pre-Op special Instructions -----  Patient verbalized understanding of instructions that were given at this phone interview. Patient denies chest pain, sob, fever, cough at the interview.

## 2023-04-21 NOTE — H&P (Signed)
 Joy Allen is an 53 y.o. female G2P2 for scheduled hysteroscopic myomectomy for a submucosal fibroid causing abnormal uterine bleeding. Joy Allen has a h/o heavy menses followed by a relative period of amenorrhea for about 3 months last fall, then in November daily bleeding began, varies from light to heavy. EMB negative 7/24. Possible fibroids palpated on exam with a retroverted uterus so a SIUS performed showing multiple fibroids, but particularly a 2-3cm fibroid that is submucosal off the posterior wall.    She also has had concurrent repetitive UTI's for about 2 months as well from different organisms--(most recent klebsiella aerogenes), not clearing with appropriate treatment, so was referred to urology for w/u. 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   Pertinent Gynecological History: Last pap: normal Date: 03/2020 OB History: NSVD x 2  Menstrual History:  Patient's last menstrual period was 04/11/2023 (approximate).    Past Medical History:  Diagnosis Date   Medical history non-contributory     Past Surgical History:  Procedure Laterality Date   BREAST BIOPSY Left 05/01/2005    Family History  Problem Relation Age of Onset   Breast cancer Maternal Grandmother 52    Social History:  reports that she has never smoked. She has never used smokeless tobacco. No history on file for alcohol use and drug use.  Allergies: No Known Allergies  No medications prior to admission.    Review of Systems  Constitutional:  Positive for fatigue.  Genitourinary:  Positive for dysuria, menstrual problem, pelvic pain and vaginal bleeding.    Height 5\' 7"  (1.702 m), weight 75.8 kg, last menstrual period 04/11/2023. Physical Exam Constitutional:      Appearance: Normal appearance.  HENT:     Head: Normocephalic.  Cardiovascular:     Rate and Rhythm: Normal rate and regular rhythm.  Pulmonary:     Effort:  Pulmonary effort is normal.  Abdominal:     General: Abdomen is flat.  Genitourinary:    General: Normal vulva.     Vagina: Normal.     Cervix: Normal.     Uterus: Enlarged (ULN retroverted).      Adnexa: Right adnexa normal and left adnexa normal.  Neurological:     General: No focal deficit present.     Mental Status: She is alert.  Psychiatric:        Mood and Affect: Mood normal.     No results found for this or any previous visit (from the past 24 hours).  No results found.  Assessment/Plan: The patient was counseled regarding the hysteroscopy procedure in detail.  We reviewed risks of bleeding and infection and possible uterine perforation.  We also discussed the removal of any identified polyps or submucosal fibroids and what that would involve. We discussed with her larger submucosal fibroid, it is possible we may not be able to resect the whole thing. The patient desires to proceed.  She will use cytotec 3 hours prior to the procedure to aid with cervical dilation.    Oliver Pila 04/21/2023, 9:07 AM

## 2023-04-23 NOTE — Anesthesia Preprocedure Evaluation (Signed)
 Anesthesia Evaluation  Patient identified by MRN, date of birth, ID band Patient awake    Reviewed: Allergy & Precautions, NPO status , Patient's Chart, lab work & pertinent test results  History of Anesthesia Complications Negative for: history of anesthetic complications  Airway Mallampati: I  TM Distance: >3 FB Neck ROM: Full    Dental no notable dental hx.    Pulmonary neg pulmonary ROS   Pulmonary exam normal        Cardiovascular negative cardio ROS Normal cardiovascular exam     Neuro/Psych negative neurological ROS  negative psych ROS   GI/Hepatic negative GI ROS, Neg liver ROS,,,  Endo/Other  negative endocrine ROS    Renal/GU negative Renal ROS  negative genitourinary   Musculoskeletal negative musculoskeletal ROS (+)    Abdominal   Peds  Hematology negative hematology ROS (+)   Anesthesia Other Findings Day of surgery medications reviewed with patient.  Reproductive/Obstetrics leiomyoma of uterus                              Anesthesia Physical Anesthesia Plan  ASA: 2  Anesthesia Plan: General   Post-op Pain Management: Tylenol PO (pre-op)* and Toradol IV (intra-op)*   Induction: Intravenous  PONV Risk Score and Plan: 3 and Ondansetron, Dexamethasone, Treatment may vary due to age or medical condition, Midazolam and Scopolamine patch - Pre-op  Airway Management Planned: LMA  Additional Equipment: None  Intra-op Plan:   Post-operative Plan: Extubation in OR  Informed Consent: I have reviewed the patients History and Physical, chart, labs and discussed the procedure including the risks, benefits and alternatives for the proposed anesthesia with the patient or authorized representative who has indicated his/her understanding and acceptance.     Dental advisory given  Plan Discussed with: CRNA  Anesthesia Plan Comments:         Anesthesia Quick  Evaluation

## 2023-04-24 ENCOUNTER — Ambulatory Visit (HOSPITAL_COMMUNITY): Payer: Self-pay | Admitting: Anesthesiology

## 2023-04-24 ENCOUNTER — Ambulatory Visit (HOSPITAL_COMMUNITY)
Admission: RE | Admit: 2023-04-24 | Discharge: 2023-04-24 | Disposition: A | Discharge status: Home / Self Care | Payer: 59 | Attending: Obstetrics and Gynecology | Admitting: Obstetrics and Gynecology

## 2023-04-24 ENCOUNTER — Encounter (HOSPITAL_COMMUNITY): Payer: Self-pay | Admitting: Obstetrics and Gynecology

## 2023-04-24 ENCOUNTER — Other Ambulatory Visit: Payer: Self-pay

## 2023-04-24 ENCOUNTER — Encounter (HOSPITAL_COMMUNITY)
Admission: RE | Disposition: A | Discharge status: Home / Self Care | Payer: Self-pay | Source: Home / Self Care | Attending: Obstetrics and Gynecology

## 2023-04-24 ENCOUNTER — Ambulatory Visit (HOSPITAL_BASED_OUTPATIENT_CLINIC_OR_DEPARTMENT_OTHER): Payer: Self-pay | Admitting: Anesthesiology

## 2023-04-24 DIAGNOSIS — Z87440 Personal history of urinary (tract) infections: Secondary | ICD-10-CM | POA: Insufficient documentation

## 2023-04-24 DIAGNOSIS — Z09 Encounter for follow-up examination after completed treatment for conditions other than malignant neoplasm: Secondary | ICD-10-CM | POA: Insufficient documentation

## 2023-04-24 DIAGNOSIS — D250 Submucous leiomyoma of uterus: Secondary | ICD-10-CM | POA: Insufficient documentation

## 2023-04-24 DIAGNOSIS — N939 Abnormal uterine and vaginal bleeding, unspecified: Secondary | ICD-10-CM | POA: Insufficient documentation

## 2023-04-24 DIAGNOSIS — Z01818 Encounter for other preprocedural examination: Secondary | ICD-10-CM

## 2023-04-24 HISTORY — PX: DILATATION & CURETTAGE/HYSTEROSCOPY WITH MYOSURE: SHX6511

## 2023-04-24 HISTORY — DX: Other specified health status: Z78.9

## 2023-04-24 LAB — CBC
HCT: 42.5 % (ref 36.0–46.0)
Hemoglobin: 14 g/dL (ref 12.0–15.0)
MCH: 30.1 pg (ref 26.0–34.0)
MCHC: 32.9 g/dL (ref 30.0–36.0)
MCV: 91.4 fL (ref 80.0–100.0)
Platelets: 425 10*3/uL — ABNORMAL HIGH (ref 150–400)
RBC: 4.65 MIL/uL (ref 3.87–5.11)
RDW: 12.7 % (ref 11.5–15.5)
WBC: 5.7 10*3/uL (ref 4.0–10.5)
nRBC: 0 % (ref 0.0–0.2)

## 2023-04-24 LAB — POCT PREGNANCY, URINE: Preg Test, Ur: NEGATIVE

## 2023-04-24 SURGERY — DILATATION & CURETTAGE/HYSTEROSCOPY WITH MYOSURE
Anesthesia: General | Site: Vagina

## 2023-04-24 MED ORDER — ONDANSETRON HCL 4 MG/2ML IJ SOLN
INTRAMUSCULAR | Status: AC
  Filled 2023-04-24: qty 2

## 2023-04-24 MED ORDER — STERILE WATER FOR IRRIGATION IR SOLN
Status: DC | PRN
  Administered 2023-04-24: 1000 mL

## 2023-04-24 MED ORDER — OXYCODONE HCL 5 MG/5ML PO SOLN: 5.0000 mg | Freq: Once | ORAL | Status: DC | PRN

## 2023-04-24 MED ORDER — LIDOCAINE HCL 1 % IJ SOLN
INTRAMUSCULAR | Status: DC | PRN
  Administered 2023-04-24: 20 mL

## 2023-04-24 MED ORDER — LIDOCAINE HCL (PF) 1 % IJ SOLN
INTRAMUSCULAR | Status: AC
  Filled 2023-04-24: qty 30

## 2023-04-24 MED ORDER — ONDANSETRON HCL 4 MG/2ML IJ SOLN
INTRAMUSCULAR | Status: DC | PRN
  Administered 2023-04-24: 4 mg via INTRAVENOUS

## 2023-04-24 MED ORDER — EPHEDRINE 5 MG/ML INJ
INTRAVENOUS | Status: AC
  Filled 2023-04-24: qty 5

## 2023-04-24 MED ORDER — ACETAMINOPHEN 500 MG PO TABS
ORAL_TABLET | ORAL | Status: AC
  Filled 2023-04-24: qty 2

## 2023-04-24 MED ORDER — KETOROLAC TROMETHAMINE 30 MG/ML IJ SOLN
INTRAMUSCULAR | Status: AC
  Filled 2023-04-24: qty 1

## 2023-04-24 MED ORDER — ACETAMINOPHEN 500 MG PO TABS
1000.0000 mg | ORAL_TABLET | Freq: Once | ORAL | Status: AC
  Administered 2023-04-24: 1000 mg via ORAL

## 2023-04-24 MED ORDER — LIDOCAINE 2% (20 MG/ML) 5 ML SYRINGE
INTRAMUSCULAR | Status: AC
  Filled 2023-04-24: qty 5

## 2023-04-24 MED ORDER — IBUPROFEN 200 MG PO TABS: 600.0000 mg | ORAL_TABLET | Freq: Four times a day (QID) | ORAL | 0 refills | Status: AC | PRN

## 2023-04-24 MED ORDER — PROPOFOL 1000 MG/100ML IV EMUL
INTRAVENOUS | Status: AC
  Filled 2023-04-24: qty 100

## 2023-04-24 MED ORDER — DEXAMETHASONE SODIUM PHOSPHATE 10 MG/ML IJ SOLN
INTRAMUSCULAR | Status: DC | PRN
  Administered 2023-04-24: 10 mg via INTRAVENOUS

## 2023-04-24 MED ORDER — EPHEDRINE SULFATE-NACL 50-0.9 MG/10ML-% IV SOSY
PREFILLED_SYRINGE | INTRAVENOUS | Status: DC | PRN
  Administered 2023-04-24: 10 mg via INTRAVENOUS

## 2023-04-24 MED ORDER — POVIDONE-IODINE 10 % EX SWAB: 2.0000 | Freq: Once | CUTANEOUS | Status: DC

## 2023-04-24 MED ORDER — DEXMEDETOMIDINE HCL IN NACL 80 MCG/20ML IV SOLN
INTRAVENOUS | Status: DC | PRN
  Administered 2023-04-24: 4 ug via INTRAVENOUS

## 2023-04-24 MED ORDER — KETOROLAC TROMETHAMINE 30 MG/ML IJ SOLN
INTRAMUSCULAR | Status: DC | PRN
Start: 2023-04-24 — End: 2023-04-24
  Administered 2023-04-24: 30 mg via INTRAVENOUS

## 2023-04-24 MED ORDER — TRANEXAMIC ACID-NACL 1000-0.7 MG/100ML-% IV SOLN
1000.0000 mg | INTRAVENOUS | Status: AC
  Administered 2023-04-24: 1000 mg via INTRAVENOUS
  Filled 2023-04-24: qty 100

## 2023-04-24 MED ORDER — FENTANYL CITRATE (PF) 250 MCG/5ML IJ SOLN
INTRAMUSCULAR | Status: AC
  Filled 2023-04-24: qty 5

## 2023-04-24 MED ORDER — PHENYLEPHRINE 80 MCG/ML (10ML) SYRINGE FOR IV PUSH (FOR BLOOD PRESSURE SUPPORT)
PREFILLED_SYRINGE | INTRAVENOUS | Status: AC
  Filled 2023-04-24: qty 10

## 2023-04-24 MED ORDER — SODIUM CHLORIDE 0.9 % IR SOLN
Status: DC | PRN
  Administered 2023-04-24 (×2): 3000 mL

## 2023-04-24 MED ORDER — MIDAZOLAM HCL 2 MG/2ML IJ SOLN
INTRAMUSCULAR | Status: AC
  Filled 2023-04-24: qty 2

## 2023-04-24 MED ORDER — SCOPOLAMINE 1 MG/3DAYS TD PT72
1.0000 | MEDICATED_PATCH | Freq: Once | TRANSDERMAL | Status: DC
  Administered 2023-04-24: 1.5 mg via TRANSDERMAL

## 2023-04-24 MED ORDER — OXYCODONE HCL 5 MG PO TABS: 5.0000 mg | ORAL_TABLET | Freq: Once | ORAL | Status: DC | PRN

## 2023-04-24 MED ORDER — ORAL CARE MOUTH RINSE: 15.0000 mL | Freq: Once | OROMUCOSAL | Status: AC

## 2023-04-24 MED ORDER — CHLORHEXIDINE GLUCONATE 0.12 % MT SOLN
OROMUCOSAL | Status: AC
  Filled 2023-04-24: qty 15

## 2023-04-24 MED ORDER — LACTATED RINGERS IV SOLN: INTRAVENOUS | Status: DC

## 2023-04-24 MED ORDER — FENTANYL CITRATE (PF) 100 MCG/2ML IJ SOLN
INTRAMUSCULAR | Status: DC | PRN
  Administered 2023-04-24 (×2): 50 ug via INTRAVENOUS

## 2023-04-24 MED ORDER — PROPOFOL 10 MG/ML IV BOLUS
INTRAVENOUS | Status: AC
  Filled 2023-04-24: qty 20

## 2023-04-24 MED ORDER — LIDOCAINE 2% (20 MG/ML) 5 ML SYRINGE
INTRAMUSCULAR | Status: DC | PRN
  Administered 2023-04-24: 100 mg via INTRAVENOUS

## 2023-04-24 MED ORDER — PROPOFOL 10 MG/ML IV BOLUS
INTRAVENOUS | Status: DC | PRN
  Administered 2023-04-24: 20 mg via INTRAVENOUS
  Administered 2023-04-24: 150 mg via INTRAVENOUS

## 2023-04-24 MED ORDER — DROPERIDOL 2.5 MG/ML IJ SOLN: 0.6250 mg | Freq: Once | INTRAMUSCULAR | Status: DC | PRN

## 2023-04-24 MED ORDER — CHLORHEXIDINE GLUCONATE 0.12 % MT SOLN
15.0000 mL | Freq: Once | OROMUCOSAL | Status: AC
  Administered 2023-04-24: 15 mL via OROMUCOSAL

## 2023-04-24 MED ORDER — SCOPOLAMINE 1 MG/3DAYS TD PT72
MEDICATED_PATCH | TRANSDERMAL | Status: AC
  Filled 2023-04-24: qty 1

## 2023-04-24 MED ORDER — FENTANYL CITRATE (PF) 100 MCG/2ML IJ SOLN: 25.0000 ug | INTRAMUSCULAR | Status: DC | PRN

## 2023-04-24 MED ORDER — MIDAZOLAM HCL 5 MG/5ML IJ SOLN
INTRAMUSCULAR | Status: DC | PRN
  Administered 2023-04-24: 2 mg via INTRAVENOUS

## 2023-04-24 MED ORDER — SILVER NITRATE-POT NITRATE 75-25 % EX MISC
CUTANEOUS | Status: DC | PRN
  Administered 2023-04-24: 3

## 2023-04-24 MED ORDER — PHENYLEPHRINE 80 MCG/ML (10ML) SYRINGE FOR IV PUSH (FOR BLOOD PRESSURE SUPPORT)
PREFILLED_SYRINGE | INTRAVENOUS | Status: DC | PRN
  Administered 2023-04-24 (×2): 80 ug via INTRAVENOUS

## 2023-04-24 SURGICAL SUPPLY — 18 items
CANISTER SUCT 3000ML PPV (MISCELLANEOUS) ×1 IMPLANT
CATH ROBINSON RED A/P 16FR (CATHETERS) ×1 IMPLANT
DEVICE MYOSURE LITE (MISCELLANEOUS) IMPLANT
DEVICE MYOSURE REACH (MISCELLANEOUS) IMPLANT
GLOVE BIO SURGEON STRL SZ 6.5 (GLOVE) ×1 IMPLANT
GLOVE SURG UNDER POLY LF SZ7 (GLOVE) ×1 IMPLANT
GOWN STRL REUS W/ TWL LRG LVL3 (GOWN DISPOSABLE) ×2 IMPLANT
KIT PROCEDURE FLUENT (KITS) ×1 IMPLANT
KIT TURNOVER KIT B (KITS) ×1 IMPLANT
MYOSURE XL FIBROID (MISCELLANEOUS) ×1 IMPLANT
PACK VAGINAL MINOR WOMEN LF (CUSTOM PROCEDURE TRAY) ×1 IMPLANT
PAD OB MATERNITY 11 LF (PERSONAL CARE ITEMS) ×1 IMPLANT
SEAL ROD LENS SCOPE MYOSURE (ABLATOR) ×1 IMPLANT
SOL .9 NS 3000ML IRR UROMATIC (IV SOLUTION) IMPLANT
SYSTEM TISS REMOVAL MYOSURE XL (MISCELLANEOUS) IMPLANT
TOWEL GREEN STERILE FF (TOWEL DISPOSABLE) ×2 IMPLANT
UNDERPAD 30X36 HEAVY ABSORB (UNDERPADS AND DIAPERS) ×1 IMPLANT
WATER STERILE IRR 1000ML POUR (IV SOLUTION) IMPLANT

## 2023-04-24 NOTE — Interval H&P Note (Signed)
 History and Physical Interval Note:  04/24/2023 10:16 AM  Joy Allen  has presented today for surgery, with the diagnosis of leiomyoma of uterus.  The various methods of treatment have been discussed with the patient and family. After consideration of risks, benefits and other options for treatment, the patient has consented to  Procedure(s) with comments: DILATATION & CURETTAGE/HYSTEROSCOPY MYOMECTOMY WITH MYOSURE REACH (N/A) - 1.5 hrs in OR/[facility]  XL and Reach myosure available as a surgical intervention.  The patient's history has been reviewed, patient examined, no change in status, stable for surgery.  I have reviewed the patient's chart and labs.  Questions were answered to the patient's satisfaction.     Oliver Pila

## 2023-04-24 NOTE — Transfer of Care (Signed)
 Immediate Anesthesia Transfer of Care Note  Patient: Denissa S Aloia  Procedure(s) Performed: DILATATION & CURETTAGE/HYSTEROSCOPY MYOMECTOMY WITH MYOSURE XL (Vagina )  Patient Location: PACU  Anesthesia Type:General  Level of Consciousness: awake, alert , oriented, and patient cooperative  Airway & Oxygen Therapy: Patient Spontanous Breathing  Post-op Assessment: Report given to RN and Post -op Vital signs reviewed and stable  Post vital signs: Reviewed and stable  Last Vitals:  Vitals Value Taken Time  BP 128/72 04/24/23 1133  Temp 36.5 C 04/24/23 1133  Pulse 91 04/24/23 1134  Resp 9 04/24/23 1134  SpO2 96 % 04/24/23 1134  Vitals shown include unfiled device data.  Last Pain:  Vitals:   04/24/23 0902  TempSrc: Oral  PainSc: 0-No pain      Patients Stated Pain Goal: 5 (04/24/23 0902)  Complications: No notable events documented.

## 2023-04-24 NOTE — Op Note (Signed)
 Operative Note    Preoperative Diagnosis Abnormal uterine bleeding Submucosal fibroids  Postoperative Diagnosis same  Procedure Hysteroscopy with resection of submucosal fibroids with Myosure XL device  Surgeon Huel Cote, MD  Anesthesia LMA  Fluids: EBL 50mL UOP voided prior to procedure IVF Hysteroscopic deficit   Findings Uterus was retroverted with submucosal fibroid on right anterior fundus 2-3cm, about 2/3 into cavity.  Smaller submucosal fibroid on floor of uterine cavity just entering the cavity 2-3cm.   Specimen Uterine fibroids  Procedure Note Patient was taken to the operating room where LMA anesthesia was obtained without difficulty. She was then prepped and draped in the normal sterile fashion in the dorsal lithotomy position. An appropriate time out was performed. A speculum was then placed within the vagina and the anterior lip of the cervix identified and injected with approximately 2 cc of 1% plain lidocaine. An additional 9 cc each was placed at 2 and 10:00 for a paracervical block. Uterus was then sounded to approximately 9 cm and the Pratt dilators utilized to dilate the cervix up to approximately 23. The Myosure operating scope was then introduced into the cervix and the cavity inspected with findings as previously noted. The Myosure XL operating blade was then introduced through the scope and under direct visualization the fibroids were shaved down until flush with the endometrium. The fibroids appeared to be removed in entirety.   There was minimal active bleeding at the conclusion of the removal. The operating scope was then removed from the cervix.    All tissue specimens were handed off to pathology. The tenaculum was then removed from the cervix and the site rendered hemostatic with silver nitrate. Finally the speculum was removed from the vagina and the patient awakened and taken to the recovery room in good condition.

## 2023-04-24 NOTE — Discharge Instructions (Signed)

## 2023-04-24 NOTE — Anesthesia Postprocedure Evaluation (Signed)
 Anesthesia Post Note  Patient: Joy Allen  Procedure(s) Performed: DILATATION & CURETTAGE/HYSTEROSCOPY MYOMECTOMY WITH MYOSURE XL (Vagina )     Patient location during evaluation: PACU Anesthesia Type: General Level of consciousness: awake and alert Pain management: pain level controlled Vital Signs Assessment: post-procedure vital signs reviewed and stable Respiratory status: spontaneous breathing, nonlabored ventilation and respiratory function stable Cardiovascular status: blood pressure returned to baseline Postop Assessment: no apparent nausea or vomiting Anesthetic complications: no   No notable events documented.  Last Vitals:  Vitals:   04/24/23 1133 04/24/23 1145  BP: 128/72 126/71  Pulse: 92 85  Resp: (!) 9 14  Temp: 36.5 C   SpO2: 99% 96%    Last Pain:  Vitals:   04/24/23 1145  TempSrc:   PainSc: 0-No pain                 Shanda Howells

## 2023-04-24 NOTE — Anesthesia Procedure Notes (Signed)
 Procedure Name: LMA Insertion Date/Time: 04/24/2023 10:41 AM  Performed by: Bishop Limbo, CRNAPre-anesthesia Checklist: Patient identified, Emergency Drugs available, Suction available and Patient being monitored Patient Re-evaluated:Patient Re-evaluated prior to induction Oxygen Delivery Method: Circle System Utilized Preoxygenation: Pre-oxygenation with 100% oxygen Induction Type: IV induction Ventilation: Mask ventilation without difficulty LMA: LMA inserted LMA Size: 4.0 Number of attempts: 1 Placement Confirmation: positive ETCO2 Tube secured with: Tape Dental Injury: Teeth and Oropharynx as per pre-operative assessment

## 2023-04-24 NOTE — Discharge Instr - Supplementary Instructions (Signed)
 Tylenol given at 09:07.

## 2023-04-25 ENCOUNTER — Encounter (HOSPITAL_COMMUNITY): Payer: Self-pay | Admitting: Obstetrics and Gynecology

## 2023-04-25 LAB — SURGICAL PATHOLOGY

## 2023-12-31 ENCOUNTER — Other Ambulatory Visit: Payer: Self-pay | Admitting: Obstetrics and Gynecology

## 2023-12-31 DIAGNOSIS — Z1231 Encounter for screening mammogram for malignant neoplasm of breast: Secondary | ICD-10-CM

## 2024-02-11 ENCOUNTER — Ambulatory Visit
Admission: RE | Admit: 2024-02-11 | Discharge: 2024-02-11 | Disposition: A | Discharge status: Home / Self Care | Source: Ambulatory Visit | Attending: Obstetrics and Gynecology | Admitting: Obstetrics and Gynecology

## 2024-02-11 DIAGNOSIS — Z1231 Encounter for screening mammogram for malignant neoplasm of breast: Secondary | ICD-10-CM
